# Patient Record
Sex: Female | Born: 1945 | Smoking: Never smoker
Health system: Southern US, Community
[De-identification: ages and names within clinical notes are randomized; demographics above are authoritative.]

## PROBLEM LIST (undated history)

## (undated) DIAGNOSIS — Z955 Presence of coronary angioplasty implant and graft: Secondary | ICD-10-CM

## (undated) DIAGNOSIS — I2510 Atherosclerotic heart disease of native coronary artery without angina pectoris: Secondary | ICD-10-CM

## (undated) HISTORY — PX: OTHER SURGICAL HISTORY: SHX169

## (undated) HISTORY — PX: ABDOMINAL HYSTERECTOMY: SHX81

---

## 2017-03-20 ENCOUNTER — Emergency Department (HOSPITAL_COMMUNITY)
Admission: EM | Admit: 2017-03-20 | Discharge: 2017-03-20 | Disposition: A | Discharge status: Home / Self Care | Payer: Medicare PPO | Attending: Emergency Medicine | Admitting: Emergency Medicine

## 2017-03-20 ENCOUNTER — Emergency Department (HOSPITAL_COMMUNITY): Payer: Medicare PPO

## 2017-03-20 ENCOUNTER — Encounter (HOSPITAL_COMMUNITY): Payer: Self-pay | Admitting: Emergency Medicine

## 2017-03-20 DIAGNOSIS — S4992XA Unspecified injury of left shoulder and upper arm, initial encounter: Secondary | ICD-10-CM | POA: Diagnosis present

## 2017-03-20 DIAGNOSIS — W108XXA Fall (on) (from) other stairs and steps, initial encounter: Secondary | ICD-10-CM | POA: Diagnosis not present

## 2017-03-20 DIAGNOSIS — W19XXXA Unspecified fall, initial encounter: Secondary | ICD-10-CM

## 2017-03-20 DIAGNOSIS — Y999 Unspecified external cause status: Secondary | ICD-10-CM | POA: Diagnosis not present

## 2017-03-20 DIAGNOSIS — Y929 Unspecified place or not applicable: Secondary | ICD-10-CM | POA: Diagnosis not present

## 2017-03-20 DIAGNOSIS — I2510 Atherosclerotic heart disease of native coronary artery without angina pectoris: Secondary | ICD-10-CM | POA: Insufficient documentation

## 2017-03-20 DIAGNOSIS — S0011XA Contusion of right eyelid and periocular area, initial encounter: Secondary | ICD-10-CM | POA: Diagnosis not present

## 2017-03-20 DIAGNOSIS — R930 Abnormal findings on diagnostic imaging of skull and head, not elsewhere classified: Secondary | ICD-10-CM | POA: Insufficient documentation

## 2017-03-20 DIAGNOSIS — S40012A Contusion of left shoulder, initial encounter: Secondary | ICD-10-CM | POA: Insufficient documentation

## 2017-03-20 DIAGNOSIS — Y939 Activity, unspecified: Secondary | ICD-10-CM | POA: Insufficient documentation

## 2017-03-20 HISTORY — DX: Atherosclerotic heart disease of native coronary artery without angina pectoris: I25.10

## 2017-03-20 HISTORY — DX: Presence of coronary angioplasty implant and graft: Z95.5

## 2017-03-20 MED ORDER — TETANUS-DIPHTH-ACELL PERTUSSIS 5-2.5-18.5 LF-MCG/0.5 IM SUSP: 0.5000 mL | Freq: Once | INTRAMUSCULAR | Status: DC

## 2017-03-20 NOTE — ED Notes (Signed)
Patient transported to X-ray 

## 2017-03-20 NOTE — ED Notes (Signed)
Pt's family called out stating the patient was vomiting. Pt refused any medications for nausea stating she just wanted to leave so that she could head back to [place] tomorrow morning.

## 2017-03-20 NOTE — ED Provider Notes (Signed)
[facility]-EMERGENCY DEPT Provider Note   CSN: [id number] Arrival date & time: 03/20/17  9629  By signing my name below, I, Octavia Heir, attest that this documentation has been prepared under the direction and in the presence of Tomasita Crumble, MD.  Electronically Signed: Octavia Heir, ED Scribe. 03/20/17. 1:00 AM.    History   Chief Complaint Chief Complaint  Patient presents with  . Fall   The history is provided by the EMS personnel. No language interpreter was used.   HPI Comments: Alexis Thomas is a 71 y.o. female brought in by ambulance, who presents to the Emergency Department complaining of moderate right knee pain, left hip pain, and left upper arm pain s/p an unwitnessed fall that occurred PTA. Pt states she was coming out of the bathroom with her luggage and purse in her hand when she was too close to the edge of the steps. She did not have any light-headed or dizziness prior to her fall. Pt reports she fell down a complete flight of stairs causing a hematoma to her right forehead. She did not lose consciousness. She expresses she is currently on Brilinta. She denies neck pain or back pain.    Past Medical History:  Diagnosis Date  . Coronary artery disease   . Hx of right coronary artery stent placement     There are no active problems to display for this patient.   Past Surgical History:  Procedure Laterality Date  . ABDOMINAL HYSTERECTOMY    . Tubuligation      OB History    No data available       Home Medications    Prior to Admission medications   Not on File    Family History No family history on file.  Social History Social History  Substance Use Topics  . Smoking status: Never Smoker  . Smokeless tobacco: Never Used  . Alcohol use No     Allergies   Other   Review of Systems Review of Systems  A complete 10 system review of systems was obtained and all systems are negative except as noted in the HPI and PMH.   Physical  Exam Updated Vital Signs BP (!) 176/76 (BP Location: Right Arm)   Pulse (!) 103   Temp 98.3 F (36.8 C) (Oral)   Resp 20   Ht  (1.727 m)   Wt 204 lb 3.2 oz (92.6 kg)   SpO2 98%   BMI 31.05 kg/m   Physical Exam  Constitutional: She is oriented to person, place, and time. She appears well-developed and well-nourished. No distress.  HENT:  Head: Normocephalic.  Nose: Nose normal.  Mouth/Throat: Oropharynx is clear and moist. No oropharyngeal exudate.  Soft tissue hematoma to right eyebrow  Eyes: Conjunctivae and EOM are normal. Pupils are equal, round, and reactive to light. No scleral icterus.  Neck: Normal range of motion. Neck supple. No JVD present. No tracheal deviation present. No thyromegaly present.  Cardiovascular: Normal rate, regular rhythm and normal heart sounds.  Exam reveals no gallop and no friction rub.   No murmur heard. Pulmonary/Chest: Effort normal and breath sounds normal. No respiratory distress. She has no wheezes. She exhibits no tenderness.  Abdominal: Soft. Bowel sounds are normal. She exhibits no distension and no mass. There is no tenderness. There is no rebound and no guarding.  Musculoskeletal: Normal range of motion. She exhibits tenderness. She exhibits no edema.  Abrasion to left shoulder, large soft tissue hematoma ~ 5 cm to  left shoulder, TTP of left hip, abrasion to right knee  Lymphadenopathy:    She has no cervical adenopathy.  Neurological: She is alert and oriented to person, place, and time. No cranial nerve deficit. She exhibits normal muscle tone.  Skin: Skin is warm and dry. No rash noted. No erythema. No pallor.  Nursing note and vitals reviewed.    ED Treatments / Results  DIAGNOSTIC STUDIES: Oxygen Saturation is 98% on RA, normal by my interpretation.  COORDINATION OF CARE:  12:57 AM Discussed treatment plan with pt at bedside and pt agreed to plan.  Labs (all labs ordered are listed, but only abnormal results are  displayed) Labs Reviewed - No data to display  EKG  EKG Interpretation None       Radiology Ct Head Wo Contrast  Result Date: 03/20/2017 CLINICAL DATA:  Larey Seat down stairs, with concern for head or cervical spine injury. Knot at the right forehead. Initial encounter. EXAM: CT HEAD WITHOUT CONTRAST CT CERVICAL SPINE WITHOUT CONTRAST TECHNIQUE: Multidetector CT imaging of the head and cervical spine was performed following the standard protocol without intravenous contrast. Multiplanar CT image reconstructions of the cervical spine were also generated. COMPARISON:  None. FINDINGS: CT HEAD FINDINGS Brain: No evidence of acute infarction, hemorrhage, hydrocephalus, extra-axial collection or mass lesion/mass effect. The posterior fossa, including the cerebellum, brainstem and fourth ventricle, is within normal limits. The third and lateral ventricles, and basal ganglia are unremarkable in appearance. The cerebral hemispheres are symmetric in appearance, with normal gray-white differentiation. No mass effect or midline shift is seen. Vascular: No hyperdense vessel or unexpected calcification. Skull: There is no evidence of fracture; visualized osseous structures are unremarkable in appearance. Sinuses/Orbits: The orbits are within normal limits. The paranasal sinuses and mastoid air cells are well-aerated. Other: Mild soft tissue swelling is noted overlying the right frontal calvarium. CT CERVICAL SPINE FINDINGS Alignment: Normal. Skull base and vertebrae: No acute fracture. No primary bone lesion or focal pathologic process. Soft tissues and spinal canal: No prevertebral fluid or swelling. No visible canal hematoma. Disc levels: Intervertebral disc space narrowing is noted at C4-C5, with anterior and posterior disc osteophyte complexes at the mid cervical spine. Mild facet disease is noted along the cervical spine. Upper chest: The thyroid gland is unremarkable. The visualized lung apices are clear. Other: No  additional soft tissue abnormalities are seen. IMPRESSION: 1. No evidence of traumatic intracranial injury or fracture. 2. No evidence of fracture or subluxation along the cervical spine. 3. Mild soft tissue swelling overlying the right frontal calvarium. 4. Mild degenerative change at the mid cervical spine. Electronically Signed   By: Roanna Raider M.D.   On: 03/20/2017 02:00   Ct Cervical Spine Wo Contrast  Result Date: 03/20/2017 CLINICAL DATA:  Larey Seat down stairs, with concern for head or cervical spine injury. Knot at the right forehead. Initial encounter. EXAM: CT HEAD WITHOUT CONTRAST CT CERVICAL SPINE WITHOUT CONTRAST TECHNIQUE: Multidetector CT imaging of the head and cervical spine was performed following the standard protocol without intravenous contrast. Multiplanar CT image reconstructions of the cervical spine were also generated. COMPARISON:  None. FINDINGS: CT HEAD FINDINGS Brain: No evidence of acute infarction, hemorrhage, hydrocephalus, extra-axial collection or mass lesion/mass effect. The posterior fossa, including the cerebellum, brainstem and fourth ventricle, is within normal limits. The third and lateral ventricles, and basal ganglia are unremarkable in appearance. The cerebral hemispheres are symmetric in appearance, with normal gray-white differentiation. No mass effect or midline shift is seen. Vascular: No  hyperdense vessel or unexpected calcification. Skull: There is no evidence of fracture; visualized osseous structures are unremarkable in appearance. Sinuses/Orbits: The orbits are within normal limits. The paranasal sinuses and mastoid air cells are well-aerated. Other: Mild soft tissue swelling is noted overlying the right frontal calvarium. CT CERVICAL SPINE FINDINGS Alignment: Normal. Skull base and vertebrae: No acute fracture. No primary bone lesion or focal pathologic process. Soft tissues and spinal canal: No prevertebral fluid or swelling. No visible canal hematoma. Disc  levels: Intervertebral disc space narrowing is noted at C4-C5, with anterior and posterior disc osteophyte complexes at the mid cervical spine. Mild facet disease is noted along the cervical spine. Upper chest: The thyroid gland is unremarkable. The visualized lung apices are clear. Other: No additional soft tissue abnormalities are seen. IMPRESSION: 1. No evidence of traumatic intracranial injury or fracture. 2. No evidence of fracture or subluxation along the cervical spine. 3. Mild soft tissue swelling overlying the right frontal calvarium. 4. Mild degenerative change at the mid cervical spine. Electronically Signed   By: Roanna Raider M.D.   On: 03/20/2017 02:00   Dg Shoulder Left  Result Date: 03/20/2017 CLINICAL DATA:  Larey Seat downstairs tonight. EXAM: LEFT SHOULDER - 2+ VIEW; LEFT HUMERUS - 2+ VIEW COMPARISON:  None. FINDINGS: The humeral head is well-formed and located. The subacromial, glenohumeral and acromioclavicular joint spaces are intact. No destructive bony lesions. Soft tissue planes are non-suspicious. IMPRESSION: Negative. Electronically Signed   By: Awilda Metro M.D.   On: 03/20/2017 01:40   Dg Knee Complete 4 Views Right  Result Date: 03/20/2017 CLINICAL DATA:  Larey Seat downstairs tonight. EXAM: RIGHT KNEE - COMPLETE 4+ VIEW COMPARISON:  None. FINDINGS: No acute fracture deformity or dislocation. Moderate patellofemoral medial compartment osteoarthrosis with marginal spurring, mild lateral compartment degenerative change. No destructive bony lesions. Mild patellar enthesopathy. Soft tissue planes are nonsuspicious. IMPRESSION: No acute fracture deformity dislocation. Tricompartmental osteoarthrosis, moderate within medial and patellofemoral compartments. Electronically Signed   By: Awilda Metro M.D.   On: 03/20/2017 01:43   Dg Humerus Left  Result Date: 03/20/2017 CLINICAL DATA:  Larey Seat downstairs tonight. EXAM: LEFT SHOULDER - 2+ VIEW; LEFT HUMERUS - 2+ VIEW COMPARISON:  None.  FINDINGS: The humeral head is well-formed and located. The subacromial, glenohumeral and acromioclavicular joint spaces are intact. No destructive bony lesions. Soft tissue planes are non-suspicious. IMPRESSION: Negative. Electronically Signed   By: Awilda Metro M.D.   On: 03/20/2017 01:40   Dg Hip Unilat W Or Wo Pelvis 2-3 Views Left  Result Date: 03/20/2017 CLINICAL DATA:  Larey Seat downstairs tonight. EXAM: DG HIP (WITH OR WITHOUT PELVIS) 2-3V LEFT COMPARISON:  None. FINDINGS: There is no evidence of hip fracture or dislocation. Mild sacroiliac osteoarthrosis. 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 There is no evidence of arthropathy or other focal bone abnormality. Phleboliths project in the pelvis. IMPRESSION: Negative. Electronically Signed   By: Awilda Metro M.D.   On: 03/20/2017 01:41    Procedures Procedures (including critical care time)  Medications Ordered in ED Medications  Tdap (BOOSTRIX) injection 0.5 mL (not administered)     Initial Impression / Assessment and Plan / ED Course  I have reviewed the triage vital signs and the nursing notes.  Pertinent labs & imaging results that were available during my care of the patient were reviewed by me and considered in my medical decision making (see chart for details).      Patient presents to the ED for a fall.  Will  obtain CT and xrays for evaluation. She has large areas of bruising, likely to being on a blood thinner.  Tetanus was updated. Will continue to observe  2:28 AM imaging is negative.  Patient continues to appear well and inNAD.  She states she nromal takes aspirin for pain.  Also advised ice packs for large areas of swelling. She has PCP fu scheduled for 3 days from now. Patient safe for DC.    Final Clinical Impressions(s) / ED Diagnoses   Final diagnoses:  Fall   I personally performed the services described in this documentation, which was scribed in my presence. The recorded information  has been reviewed and is accurate.    New Prescriptions New Prescriptions   No medications on file     Tomasita Crumble, MD 03/20/17 [id number]

## 2017-03-20 NOTE — ED Triage Notes (Signed)
Pt was carrying some items down the stairs when she tripped over her feet and fell down the stairs. Pt denies LOC. Pt complains of right hip pain and left knee pain. Pt does have a knot on her right forehead.

## 2017-12-23 IMAGING — CT CT CERVICAL SPINE W/O CM
4 of 8 series · 10 of 33 positions shown, 11 images · non-contrast
Comparison: None.

CLINICAL DATA: Fell down stairs, with concern for head or cervical
spine injury. Knot at the right forehead. Initial encounter.

EXAM:
CT HEAD WITHOUT CONTRAST
CT CERVICAL SPINE WITHOUT CONTRAST
TECHNIQUE: Multidetector CT imaging of the head and cervical spine was
performed following the standard protocol without intravenous
contrast. Multiplanar CT image reconstructions of the cervical spine
were also generated.

[Series 302: soft tissue, idose (2) · axial · 0.31mm/px · z∈[+8,+62]mm · 2 of 83 slices shown]
[im 28/83  soft-tissue]
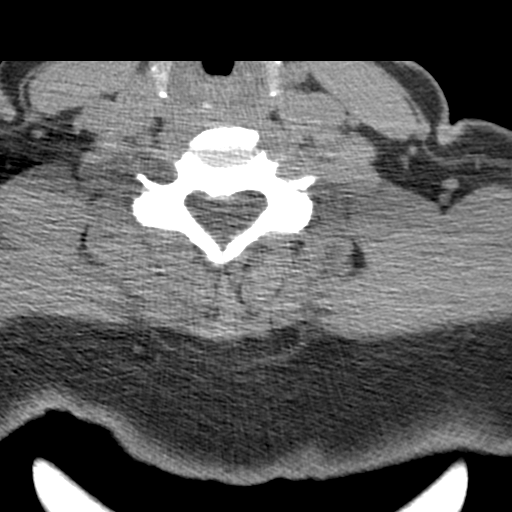
[im 55/83  soft-tissue]
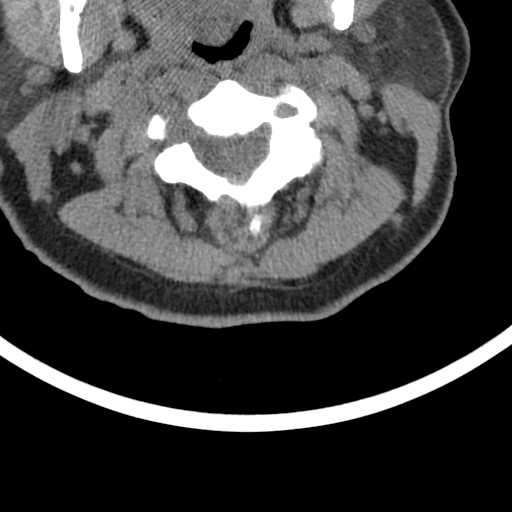

[Series 304: coronal, idose (2) · coronal · 0.31mm/px · 1 of 60 slices shown]
[im 30/60  bone]
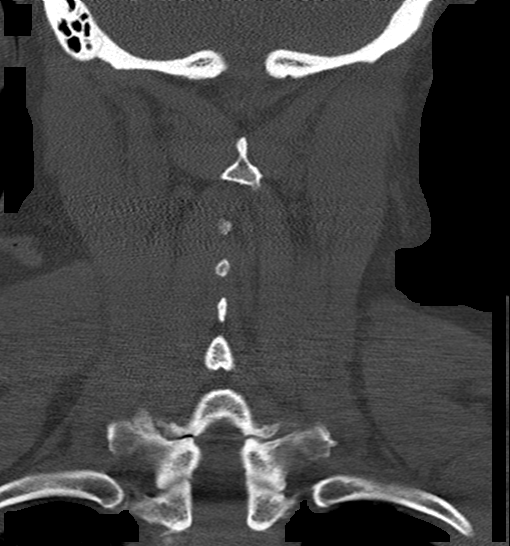

[Series 305: sagittal, idose (2) · sagittal · 0.31mm/px · 5 of 78 slices shown]
[im 13/78  bone]
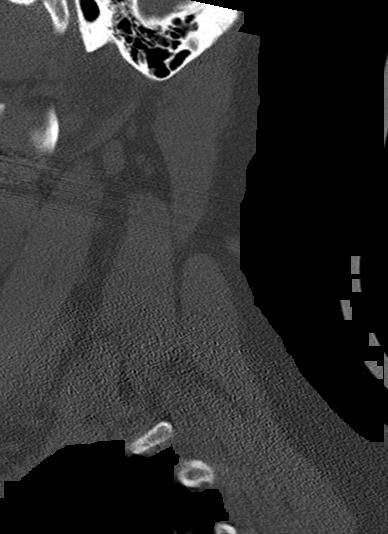
[im 26/78  bone]
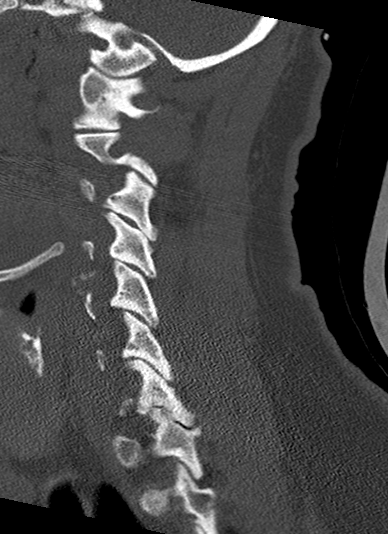
[im 39/78  bone]
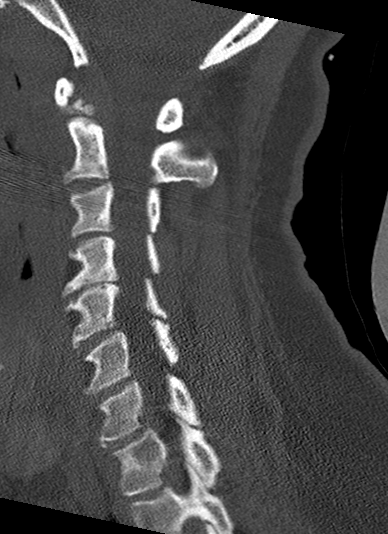
[im 52/78  bone]
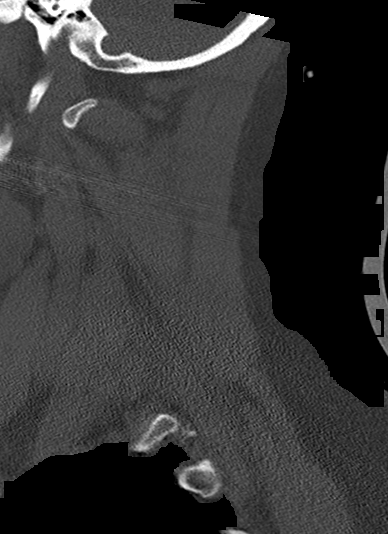
[im 65/78  bone]
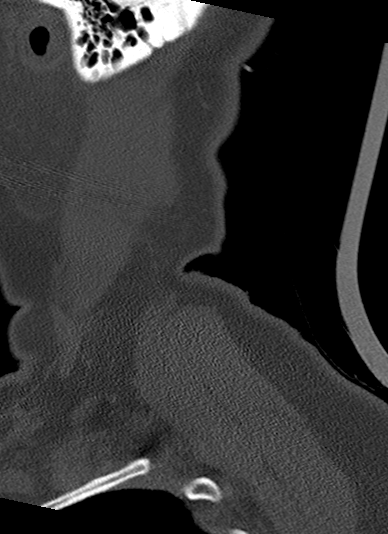

[Series 306: orthogonals, idose (2) · axial · 0.31mm/px · z∈[-3,+50]mm · 2 of 83 slices shown, 3 images]
[im 28/83  soft-tissue]
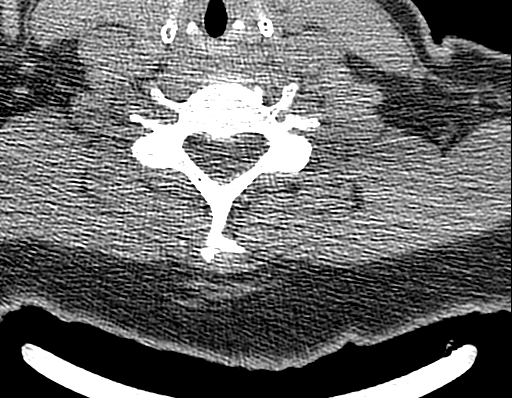
[im 28/83  bone]
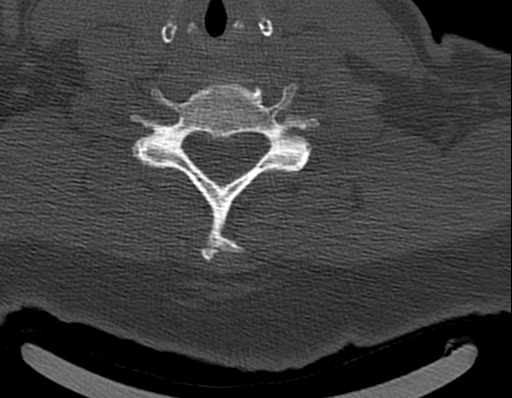
[im 55/83  bone]
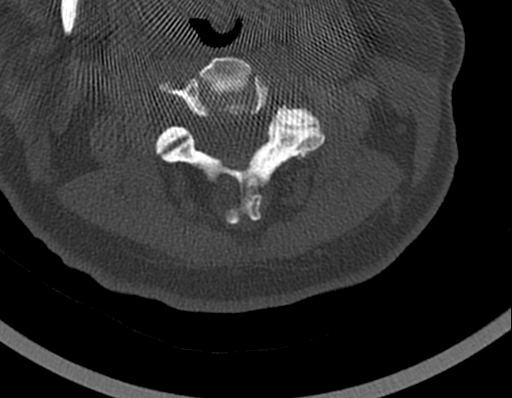

[10 of 33 positions shown; findings below may reference images not displayed]

FINDINGS: CT HEAD FINDINGS

Brain: No evidence of acute infarction, hemorrhage, hydrocephalus,
extra-axial collection or mass lesion/mass effect.

The posterior fossa, including the cerebellum, brainstem and fourth
ventricle, is within normal limits. The third and lateral
ventricles, and basal ganglia are unremarkable in appearance. The
cerebral hemispheres are symmetric in appearance, with normal
gray-white differentiation. No mass effect or midline shift is seen.

Vascular: No hyperdense vessel or unexpected calcification.

Skull: There is no evidence of fracture; visualized osseous
structures are unremarkable in appearance.

Sinuses/Orbits: The orbits are within normal limits. The paranasal
sinuses and mastoid air cells are well-aerated.

Other: Mild soft tissue swelling is noted overlying the right
frontal calvarium.

CT CERVICAL SPINE FINDINGS

Alignment: Normal.

Skull base and vertebrae: No acute fracture. No primary bone lesion
or focal pathologic process.

Soft tissues and spinal canal: No prevertebral fluid or swelling. No
visible canal hematoma.

Disc levels: Intervertebral disc space narrowing is noted at C4-C5,
with anterior and posterior disc osteophyte complexes at the mid
cervical spine. Mild facet disease is noted along the cervical
spine.

Upper chest: The thyroid gland is unremarkable. The visualized lung
apices are clear.

Other: No additional soft tissue abnormalities are seen.
IMPRESSION: 1. No evidence of traumatic intracranial injury or fracture.
2. No evidence of fracture or subluxation along the cervical spine.
3. Mild soft tissue swelling overlying the right frontal calvarium.
4. Mild degenerative change at the mid cervical spine.
